# Patient Record
Sex: Female | Born: 1975 | Race: White | Marital: Married | Smoking: Never smoker
Health system: Southern US, Community
[De-identification: ages and names within clinical notes are randomized; demographics above are authoritative.]

## PROBLEM LIST (undated history)

## (undated) DIAGNOSIS — L709 Acne, unspecified: Secondary | ICD-10-CM

## (undated) DIAGNOSIS — F419 Anxiety disorder, unspecified: Secondary | ICD-10-CM

## (undated) HISTORY — PX: DILATION AND CURETTAGE OF UTERUS: SHX78

## (undated) HISTORY — PX: ELBOW SURGERY: SHX618

## (undated) HISTORY — PX: BREAST EXCISIONAL BIOPSY: SUR124

---

## 2011-04-15 ENCOUNTER — Other Ambulatory Visit: Payer: Self-pay | Admitting: Obstetrics and Gynecology

## 2011-04-15 DIAGNOSIS — Z1231 Encounter for screening mammogram for malignant neoplasm of breast: Secondary | ICD-10-CM

## 2011-05-05 ENCOUNTER — Ambulatory Visit: Payer: Self-pay

## 2013-11-23 ENCOUNTER — Other Ambulatory Visit: Payer: Self-pay | Admitting: Obstetrics and Gynecology

## 2013-11-23 DIAGNOSIS — R922 Inconclusive mammogram: Secondary | ICD-10-CM

## 2014-01-31 ENCOUNTER — Ambulatory Visit
Admission: RE | Admit: 2014-01-31 | Discharge: 2014-01-31 | Disposition: A | Discharge status: Home / Self Care | Payer: BC Managed Care – PPO | Source: Ambulatory Visit | Attending: Obstetrics and Gynecology | Admitting: Obstetrics and Gynecology

## 2014-01-31 DIAGNOSIS — R922 Inconclusive mammogram: Secondary | ICD-10-CM

## 2014-01-31 MED ORDER — GADOBENATE DIMEGLUMINE 529 MG/ML IV SOLN
11.0000 mL | Freq: Once | INTRAVENOUS | Status: AC | PRN
  Administered 2014-01-31: 11 mL via INTRAVENOUS

## 2014-01-31 MED ORDER — GADOBENATE DIMEGLUMINE 529 MG/ML IV SOLN: 11.0000 mL | Freq: Once | INTRAVENOUS | Status: AC | PRN

## 2015-01-22 ENCOUNTER — Other Ambulatory Visit: Payer: Self-pay | Admitting: Obstetrics and Gynecology

## 2015-01-22 DIAGNOSIS — Z803 Family history of malignant neoplasm of breast: Secondary | ICD-10-CM

## 2015-03-20 ENCOUNTER — Emergency Department (HOSPITAL_BASED_OUTPATIENT_CLINIC_OR_DEPARTMENT_OTHER)
Admission: EM | Admit: 2015-03-20 | Discharge: 2015-03-20 | Disposition: A | Discharge status: Home / Self Care | Payer: BC Managed Care – PPO | Attending: Emergency Medicine | Admitting: Emergency Medicine

## 2015-03-20 ENCOUNTER — Encounter (HOSPITAL_BASED_OUTPATIENT_CLINIC_OR_DEPARTMENT_OTHER): Payer: Self-pay

## 2015-03-20 DIAGNOSIS — Z79899 Other long term (current) drug therapy: Secondary | ICD-10-CM | POA: Diagnosis not present

## 2015-03-20 DIAGNOSIS — Z792 Long term (current) use of antibiotics: Secondary | ICD-10-CM | POA: Diagnosis not present

## 2015-03-20 DIAGNOSIS — T50905A Adverse effect of unspecified drugs, medicaments and biological substances, initial encounter: Secondary | ICD-10-CM

## 2015-03-20 DIAGNOSIS — T364X5A Adverse effect of tetracyclines, initial encounter: Secondary | ICD-10-CM | POA: Insufficient documentation

## 2015-03-20 DIAGNOSIS — R42 Dizziness and giddiness: Secondary | ICD-10-CM | POA: Diagnosis not present

## 2015-03-20 DIAGNOSIS — M546 Pain in thoracic spine: Secondary | ICD-10-CM | POA: Diagnosis present

## 2015-03-20 HISTORY — DX: Acne, unspecified: L70.9

## 2015-03-20 HISTORY — DX: Anxiety disorder, unspecified: F41.9

## 2015-03-20 MED ORDER — MECLIZINE HCL 25 MG PO TABS: 25.0000 mg | ORAL_TABLET | Freq: Four times a day (QID) | ORAL | Status: AC

## 2015-03-20 NOTE — ED Notes (Signed)
C/o neck pain, dizziness-started this am-states s/s feel same as with ? allergic reaction to bactrim approx 6 months ago-pt stated bactrim last night for "cystic acne"-2 doses-pt NAD with steady gait

## 2015-03-20 NOTE — ED Provider Notes (Signed)
CSN: [id number]     Arrival date & time 03/20/15  1229 History   First MD Initiated Contact with Patient 03/20/15 1321     Chief Complaint  Patient presents with  . Neck Pain     (Consider location/radiation/quality/duration/timing/severity/associated sxs/prior Treatment) Patient is a 39 y.o. female presenting with neck pain. The history is provided by the patient.  Neck Pain Associated symptoms: no chest pain, no fever, no headaches, no photophobia and no weakness    Patient with similar reaction when she took Septra in the past for her cystic acne. Patient is been taking doxycycline since that. Since it wasn't a classic allergic reaction her doctor decided to try her back on the Septra. She took the first dose last evening and second dose this morning at 8 and has had a recurrence of the exact same symptoms that occurred before with bilateral neck stiffness. And feeling weird. This time however there was vertigo associated with it. Patient does have some mild upper respiratory symptoms. No fevers no nausea no vomiting no lip swelling no tongue swelling no rash no hives. Check   Ease suggest pain Past Surgical History  Procedure Laterality Date  . Elbow surgery    . Dilation and curettage of uterus     No family history on file. History  Substance Use Topics  . Smoking status: Never Smoker   . Smokeless tobacco: Not on file  . Alcohol Use: Yes   OB History    No data available     Review of Systems  Constitutional: Negative for fever.  HENT: Negative for congestion.   Eyes: Negative for photophobia, redness and visual disturbance.  Respiratory: Negative for shortness of breath.   Cardiovascular: Negative for chest pain.  Gastrointestinal: Negative for nausea, vomiting and abdominal pain.  Genitourinary: Negative for dysuria.  Musculoskeletal: Positive for neck pain and neck stiffness.  Skin: Negative for rash.  Neurological: Positive for dizziness. Negative for speech  difficulty, weakness and headaches.  Hematological: Does not bruise/bleed easily.  Psychiatric/Behavioral: Negative for confusion.      Allergies  Sulfa antibiotics  Home Medications   Prior to Admission medications   Medication Sig Start Date End Date Taking? Authorizing Provider  doxycycline (ORACEA) 40 MG capsule Take 40 mg by mouth every morning.   Yes Historical Provider, MD  PARoxetine HCl (PAXIL PO) Take by mouth.   Yes Historical Provider, MD  meclizine (ANTIVERT) 25 MG tablet Take 1 tablet (25 mg total) by mouth 4 (four) times daily. 03/20/15   Vanetta Mulders, MD   BP 140/92 mmHg  Pulse 82  Temp(Src) 97.6 F (36.4 C) (Oral)  Resp 18  Ht 5\' 6"  (1.676 m)  Wt 125 lb (56.7 kg)  BMI 20.19 kg/m2  SpO2 98% Physical Exam  Constitutional: She is oriented to person, place, and time. She appears well-developed and well-nourished. No distress.  HENT:  Head: Normocephalic and atraumatic.  Mouth/Throat: Oropharynx is clear and moist.  Eyes: Conjunctivae and EOM are normal. Pupils are equal, round, and reactive to light.  Neck: Normal range of motion. No tracheal deviation present.  Neck with the stiffness not so much posteriorly but with left to right movement.   Cardiovascular: Normal rate and regular rhythm.   Pulmonary/Chest: Effort normal and breath sounds normal. No stridor. No respiratory distress.  Abdominal: Soft. Bowel sounds are normal. There is no tenderness.  Musculoskeletal: Normal range of motion.  Lymphadenopathy:    She has no cervical adenopathy.  Neurological: She is  alert and oriented to person, place, and time. No cranial nerve deficit. She exhibits normal muscle tone. Coordination normal.  Skin: Skin is warm. No rash noted. No erythema.  Nursing note and vitals reviewed.   ED Course  Procedures (including critical care time) Labs Review Labs Reviewed - No data to display  Imaging Review No results found.   EKG Interpretation None      MDM    Final diagnoses:  Medication side effect, initial encounter  Vertigo    Patient with bilateral neck pain and vertigo. And some dizziness. Very similar symptoms that happened before when she was placed on Septra for her acne. Patient was switched from doxycycline by her doctor and started Septra last night had one dose and had another dose this morning at about 8 in the morning. Same symptoms of occurred with the neck stiffness. Patient has no lip swelling no tongue swelling no rash no hives no symptoms classic for an allergic reaction. It is possible this could be a side effect to Septra that is unique to her. Probably not just a coincidence as this has occurred again when she's taken the Septra. In addition she says symptoms are starting to resolve which occurred as the Septra were off before.  The dizziness and vertigo are somewhat new. Patient does have a mild upper respiratory infection. This possible that that could be viral based. Or could be related to the Septra. Will treat the patient with anti-for if that persists if it does not resolve over 70 days will require additional workup. Patient feels strongly that all symptoms are related to the Septra.  Patient did have a tick bite recently but going back on the doxycycline should resolve that as a possible cause of infection.   Patient is nontoxic no acute distress.    Vanetta Mulders, MD 03/20/15 1349

## 2015-03-20 NOTE — Discharge Instructions (Signed)
Stop the Septra Bactrim medication. Restart doxycycline. Take Benadryl 25 mg every 6 hours at least for 24 hours. Take Motrin or Naprosyn as needed. If the vertigo symptoms do not improve by tomorrow would recommend taking the Antivert. If the vertigo symptoms persist for a week would recommend additional workup. Return for any new or worse symptoms to include fever.

## 2016-04-12 ENCOUNTER — Other Ambulatory Visit: Payer: Self-pay | Admitting: Obstetrics and Gynecology

## 2016-04-12 DIAGNOSIS — Z803 Family history of malignant neoplasm of breast: Secondary | ICD-10-CM

## 2016-05-05 ENCOUNTER — Ambulatory Visit
Admission: RE | Admit: 2016-05-05 | Discharge: 2016-05-05 | Disposition: A | Discharge status: Home / Self Care | Payer: BC Managed Care – PPO | Source: Ambulatory Visit | Attending: Obstetrics and Gynecology | Admitting: Obstetrics and Gynecology

## 2016-05-05 DIAGNOSIS — Z803 Family history of malignant neoplasm of breast: Secondary | ICD-10-CM

## 2017-04-08 ENCOUNTER — Other Ambulatory Visit: Payer: Self-pay | Admitting: Nurse Practitioner

## 2017-04-08 DIAGNOSIS — N63 Unspecified lump in unspecified breast: Secondary | ICD-10-CM

## 2017-04-11 ENCOUNTER — Ambulatory Visit
Admission: RE | Admit: 2017-04-11 | Discharge: 2017-04-11 | Disposition: A | Discharge status: Home / Self Care | Payer: Self-pay | Source: Ambulatory Visit | Attending: Nurse Practitioner | Admitting: Nurse Practitioner

## 2017-04-11 ENCOUNTER — Ambulatory Visit
Admission: RE | Admit: 2017-04-11 | Discharge: 2017-04-11 | Disposition: A | Discharge status: Home / Self Care | Payer: BC Managed Care – PPO | Source: Ambulatory Visit | Attending: Nurse Practitioner | Admitting: Nurse Practitioner

## 2017-04-11 DIAGNOSIS — N63 Unspecified lump in unspecified breast: Secondary | ICD-10-CM

## 2017-04-14 ENCOUNTER — Other Ambulatory Visit: Payer: Self-pay

## 2018-02-13 ENCOUNTER — Ambulatory Visit: Payer: Self-pay | Admitting: Pediatrics

## 2018-03-29 ENCOUNTER — Other Ambulatory Visit: Payer: Self-pay | Admitting: Obstetrics and Gynecology

## 2018-03-29 DIAGNOSIS — Z803 Family history of malignant neoplasm of breast: Secondary | ICD-10-CM

## 2018-04-11 ENCOUNTER — Ambulatory Visit
Admission: RE | Admit: 2018-04-11 | Discharge: 2018-04-11 | Disposition: A | Discharge status: Home / Self Care | Payer: BC Managed Care – PPO | Source: Ambulatory Visit | Attending: Obstetrics and Gynecology | Admitting: Obstetrics and Gynecology

## 2018-04-11 DIAGNOSIS — Z803 Family history of malignant neoplasm of breast: Secondary | ICD-10-CM

## 2018-04-11 MED ORDER — GADOBENATE DIMEGLUMINE 529 MG/ML IV SOLN
10.0000 mL | Freq: Once | INTRAVENOUS | Status: AC | PRN
  Administered 2018-04-11: 10 mL via INTRAVENOUS

## 2018-08-28 ENCOUNTER — Other Ambulatory Visit: Payer: Self-pay | Admitting: Obstetrics and Gynecology

## 2018-08-28 DIAGNOSIS — N6489 Other specified disorders of breast: Secondary | ICD-10-CM

## 2018-10-18 ENCOUNTER — Ambulatory Visit
Admission: RE | Admit: 2018-10-18 | Discharge: 2018-10-18 | Disposition: A | Discharge status: Home / Self Care | Payer: BC Managed Care – PPO | Source: Ambulatory Visit | Attending: Obstetrics and Gynecology | Admitting: Obstetrics and Gynecology

## 2018-10-18 DIAGNOSIS — N6489 Other specified disorders of breast: Secondary | ICD-10-CM

## 2018-10-18 MED ORDER — GADOBUTROL 1 MMOL/ML IV SOLN
5.0000 mL | Freq: Once | INTRAVENOUS | Status: AC | PRN
  Administered 2018-10-18: 5 mL via INTRAVENOUS

## 2018-10-23 ENCOUNTER — Other Ambulatory Visit: Payer: Self-pay | Admitting: Obstetrics and Gynecology

## 2018-10-23 DIAGNOSIS — N6310 Unspecified lump in the right breast, unspecified quadrant: Secondary | ICD-10-CM

## 2018-10-31 ENCOUNTER — Other Ambulatory Visit: Payer: Self-pay | Admitting: Obstetrics and Gynecology

## 2018-10-31 ENCOUNTER — Ambulatory Visit
Admission: RE | Admit: 2018-10-31 | Discharge: 2018-10-31 | Disposition: A | Discharge status: Home / Self Care | Payer: BC Managed Care – PPO | Source: Ambulatory Visit | Attending: Obstetrics and Gynecology | Admitting: Obstetrics and Gynecology

## 2018-10-31 DIAGNOSIS — N6310 Unspecified lump in the right breast, unspecified quadrant: Secondary | ICD-10-CM

## 2018-11-01 ENCOUNTER — Other Ambulatory Visit: Payer: Self-pay | Admitting: Obstetrics and Gynecology

## 2018-11-01 DIAGNOSIS — N6310 Unspecified lump in the right breast, unspecified quadrant: Secondary | ICD-10-CM

## 2019-05-03 ENCOUNTER — Other Ambulatory Visit: Payer: BC Managed Care – PPO

## 2019-08-02 ENCOUNTER — Other Ambulatory Visit: Payer: Self-pay | Admitting: Obstetrics and Gynecology

## 2019-08-02 DIAGNOSIS — N6310 Unspecified lump in the right breast, unspecified quadrant: Secondary | ICD-10-CM

## 2019-08-10 ENCOUNTER — Other Ambulatory Visit: Payer: BC Managed Care – PPO

## 2019-08-17 ENCOUNTER — Other Ambulatory Visit: Payer: BC Managed Care – PPO

## 2019-09-04 ENCOUNTER — Ambulatory Visit
Admission: RE | Admit: 2019-09-04 | Discharge: 2019-09-04 | Disposition: A | Discharge status: Home / Self Care | Payer: BC Managed Care – PPO | Source: Ambulatory Visit | Attending: Obstetrics and Gynecology | Admitting: Obstetrics and Gynecology

## 2019-09-04 ENCOUNTER — Other Ambulatory Visit: Payer: Self-pay

## 2019-09-04 DIAGNOSIS — N6310 Unspecified lump in the right breast, unspecified quadrant: Secondary | ICD-10-CM

## 2020-03-09 IMAGING — MR MR BILATERAL BREAST WITHOUT AND WITH CONTRAST
8 of 12 series · 30 of 48 positions shown · IV contrast (5 ml gadavist)
Comparison: Prior breast MRIs dated 04/11/2018, 05/05/2016 and
06/02/2014.

CLINICAL DATA: Patient reports a new lump in the right breast.
and a reported lumpectomy in 5552. This was presumably a benign
lumpectomy since there is no reported history breast carcinoma in
this patient. Her mother was diagnosed with breast carcinoma at age
42. Most recent prior breast MRI showed multiple enhancing
foci/masses greater in the right breast than the left.

LABS:  No labs drawn at time of imaging
EXAM:
BILATERAL BREAST MRI WITH AND WITHOUT CONTRAST
TECHNIQUE: Multiplanar, multisequence MR images of both breasts were obtained
prior to and following the intravenous administration of 5 ml of
Gadavist

[Series 2: t2_tirm_tra ipat (a-p) · axial · 3.0mm · 0.66mm/px · 1 of 55 slices shown]
[im 1/55]
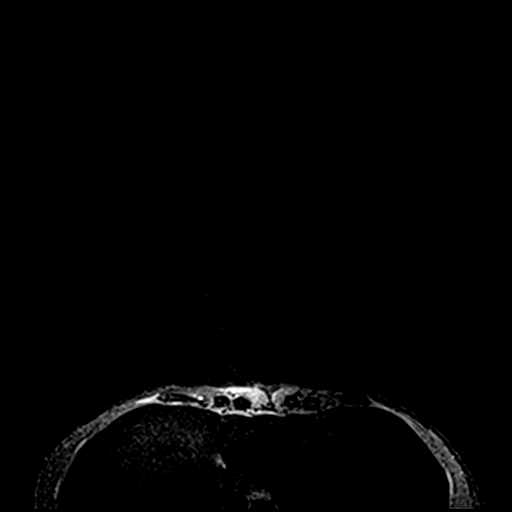

[Series 3: fl3d pre-cm no · axial · non-contrast · 1.2mm · 0.89mm/px · z∈[-79,+93]mm · 5 of 144 slices shown]
[im 1/144]
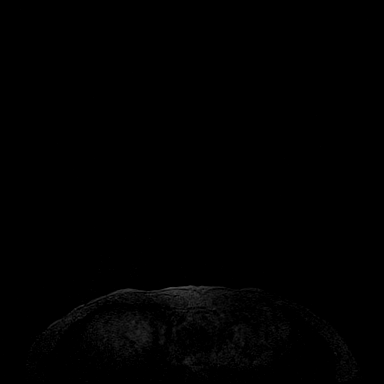
[im 36/144]
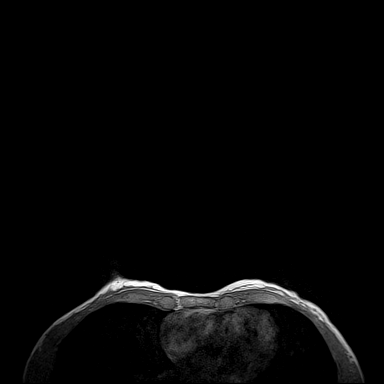
[im 72/144]
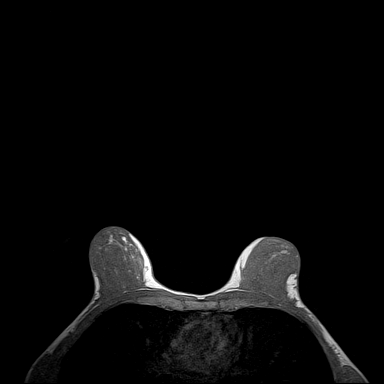
[im 108/144]
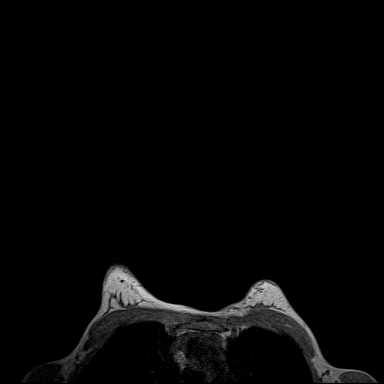
[im 144/144]
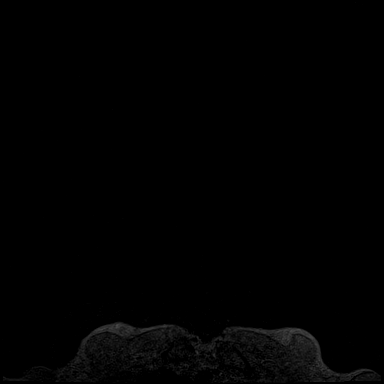

[Series 4: fl3d pre-cm · axial · non-contrast · 1.2mm · 0.89mm/px · z∈[-79,+93]mm · 5 of 144 slices shown]
[im 1/144]
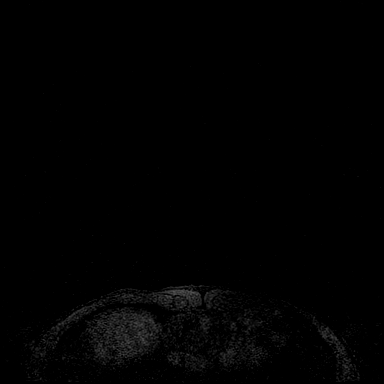
[im 36/144]
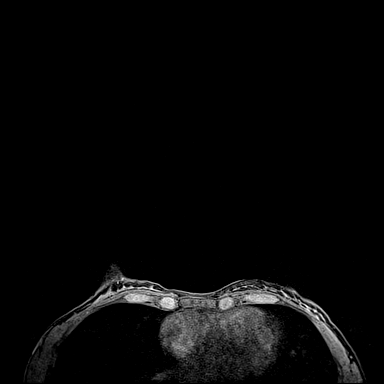
[im 72/144]
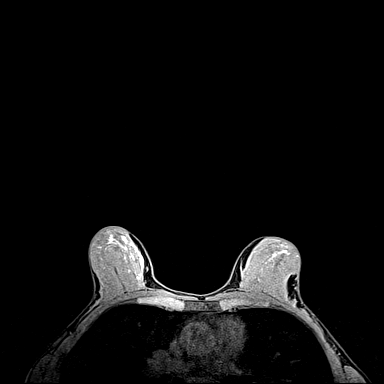
[im 108/144]
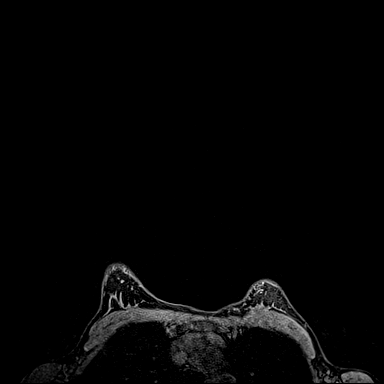
[im 144/144]
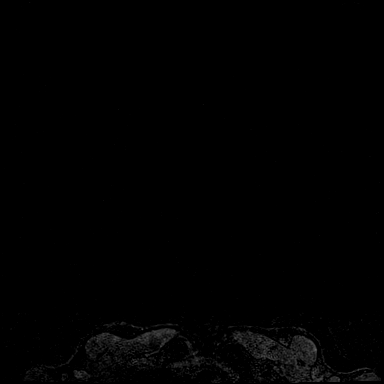

[Series 5: fl3d post immediate · axial · 1.2mm · 0.89mm/px · z∈[-79,+93]mm · 5 of 144 slices shown (1 of 3)]
[im 1/144]
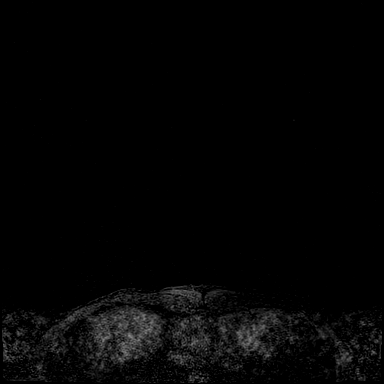
[im 36/144]
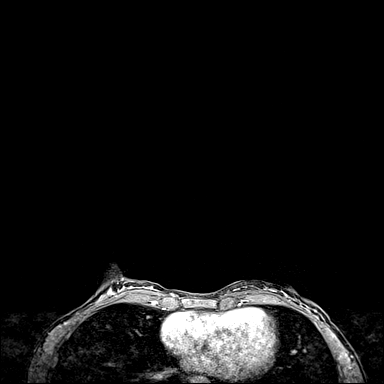
[im 72/144]
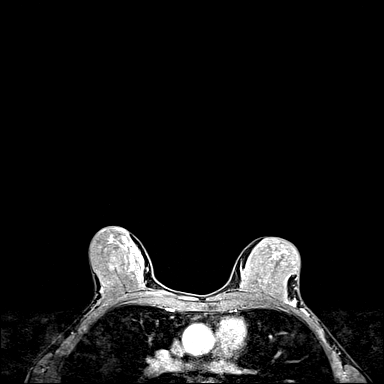
[im 108/144]
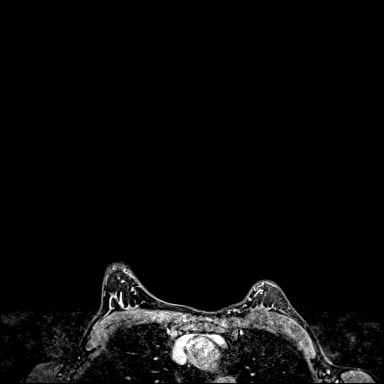
[im 144/144]
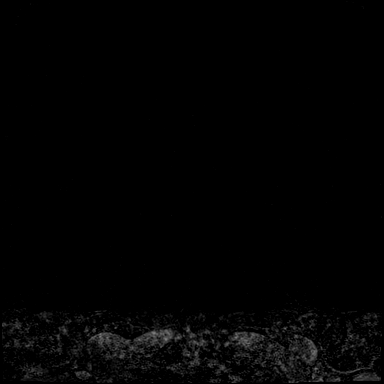

[Series 6: fl3d post immediate · axial · 1.2mm · 0.89mm/px · z∈[-79,+93]mm · 5 of 144 slices shown (2 of 3)]
[im 1/144]
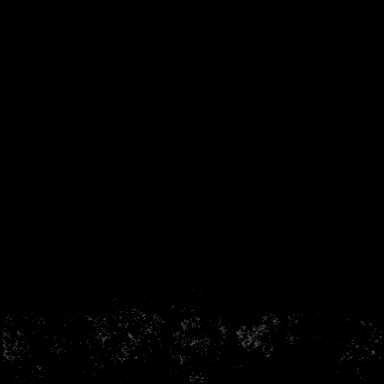
[im 36/144]
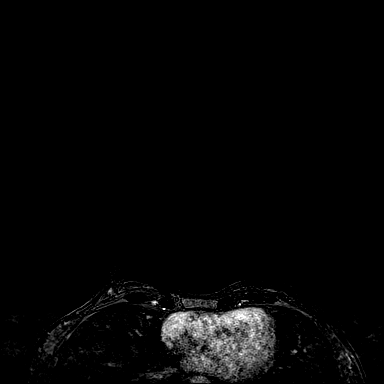
[im 72/144]
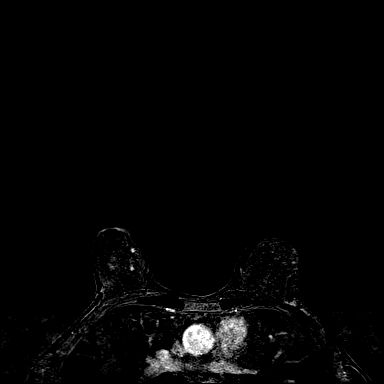
[im 108/144]
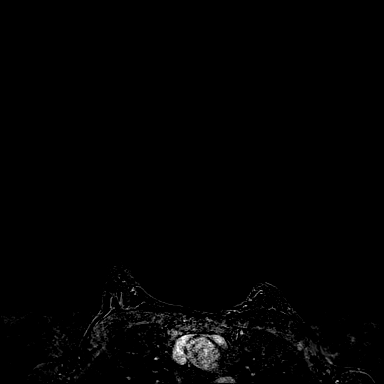
[im 144/144]
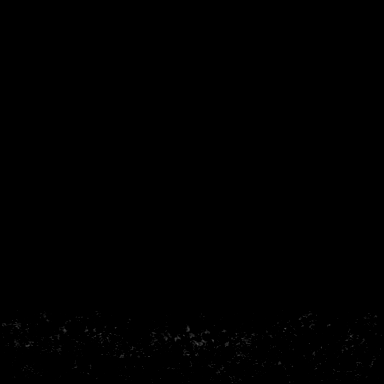

[Series 7: fl3d post immediate · axial · 172.8mm · 0.89mm/px · 1 of 1 slices shown (3 of 3)]
[im 1/1]
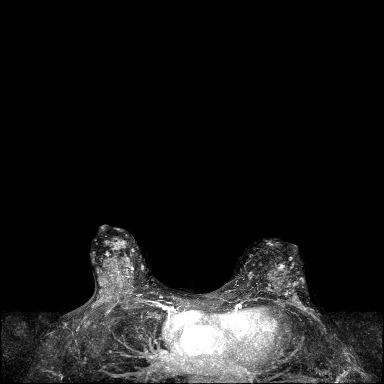

[Series 8: fl3d post 3min · axial · 1.2mm · 0.89mm/px · z∈[-79,+93]mm · 6 of 144 slices shown]
[im 1/144]
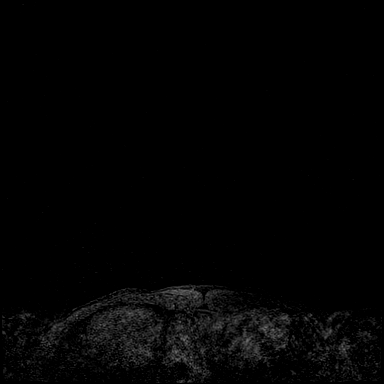
[im 29/144]
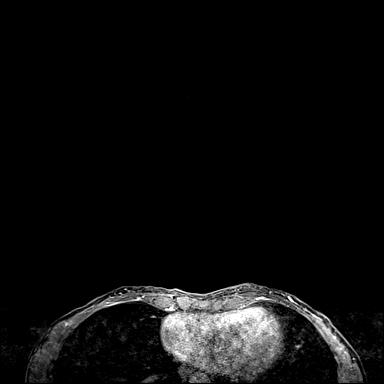
[im 58/144]
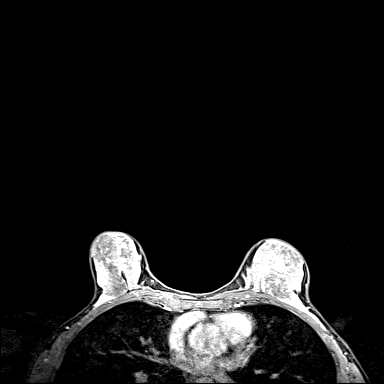
[im 86/144]
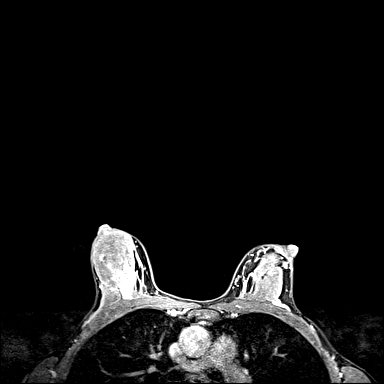
[im 115/144]
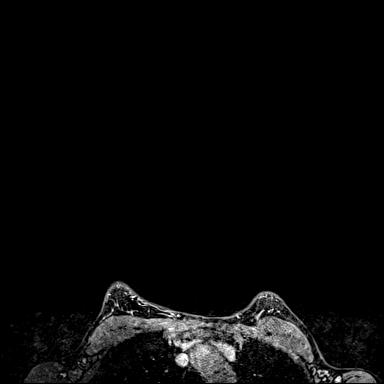
[im 144/144]
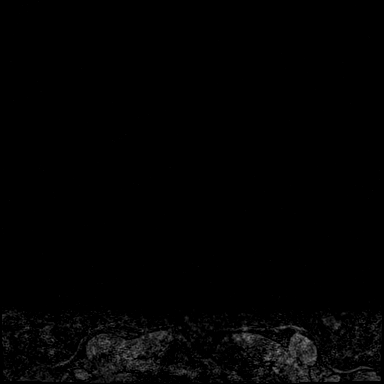

[Series 9: fl3d post 3min_sub · axial · 1.2mm · 0.89mm/px · z∈[-79,-45]mm · 2 of 144 slices shown]
[im 1/144]
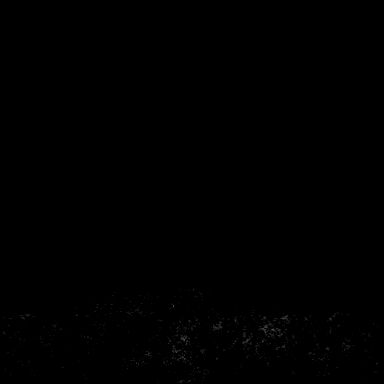
[im 29/144]
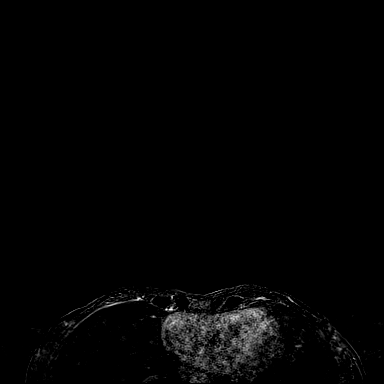

[30 of 48 positions shown; findings below may reference images not displayed]

Three-dimensional MR images were rendered by post-processing of the
original MR data on an independent workstation. The
three-dimensional MR images were interpreted, and findings are
reported in the following complete MRI report for this study. Three
dimensional images were evaluated at the independent [employer]
workstation
Prior mammography and breast ultrasound, most recent
right breast diagnostic mammography and ultrasound dated 10/31/2018.
FINDINGS: Breast composition: d. Extreme fibroglandular tissue.

Background parenchymal enhancement: Extensive

Right breast: In addition to extensive background enhancement, which
is asymmetrically greater on the right than on the left, there are
multiple focal areas enhancement, greater in number than noted on
the most recent prior breast MRI, but none of which are dominant or
show more aggressive enhancement characteristics. There are no
discrete masses or areas of abnormal enhancement to indicate breast
malignancy.

Left breast: There are multiple small foci/focal areas of
enhancement, in addition to more diffuse background enhancement,
that are similar to those seen on the right, but less in number.
Overall background enhancement is less on the left than on the
right. There are no dominant enhancing masses, however, to suggest
malignancy.

Lymph nodes: No abnormal appearing lymph nodes.

Ancillary findings:  None.
IMPRESSION: 1. There is extensive bilateral background enhancement, right
greater than left, associated with multiple small enhancing foci and
larger focal areas of enhancement, also greater the right. However,
there are no dominant enhancing lesion on either side to suggest
malignancy. All of this abnormal enhancement is felt to be extensive
background enhancement. The enhancing foci/masses noted on the most
recent prior MRI, again more on the right than the left, have
increased in number from that exam. Several, however, are similar to
older exams, and the extensive background enhancement was seen on
older prior breast MRIs. These findings of extensive background
enhancement and multiple foci/focal areas similar appearing
enhancement in the breasts show a waxing and waning appearance over
the course the prior MRIs dating back to 9651. All of this supports
these areas being background enhancement only.
2. Diagnostic right breast mammography and ultrasound performed
yesterday, for a palpable right breast abnormality, showed a
probably benign mass on ultrasound with no mammographic abnormality.
The ultrasound mass was felt most likely to be a lymph node. She was
scheduled for six-month diagnostic right breast mammography and
ultrasound.

RECOMMENDATION:
1. Six-month diagnostic right breast mammography and ultrasound as
recommended on the diagnostic mammography and ultrasound performed
on 10/31/2018.
[DATE]. Recommend annual breast MRI based on the [employer]
recommendations given the patient's greater than 20% lifetime risk
for developing breast carcinoma and her extremely dense breasts
architecture mammographically.

BI-RADS CATEGORY  2: Benign.

## 2020-03-18 ENCOUNTER — Ambulatory Visit: Payer: Self-pay | Admitting: Allergy and Immunology

## 2021-01-01 ENCOUNTER — Other Ambulatory Visit: Payer: Self-pay | Admitting: Obstetrics and Gynecology

## 2021-01-01 DIAGNOSIS — Z9189 Other specified personal risk factors, not elsewhere classified: Secondary | ICD-10-CM

## 2021-01-17 ENCOUNTER — Ambulatory Visit
Admission: RE | Admit: 2021-01-17 | Discharge: 2021-01-17 | Disposition: A | Discharge status: Home / Self Care | Payer: BC Managed Care – PPO | Source: Ambulatory Visit | Attending: Obstetrics and Gynecology | Admitting: Obstetrics and Gynecology

## 2021-01-17 ENCOUNTER — Other Ambulatory Visit: Payer: Self-pay

## 2021-01-17 DIAGNOSIS — Z9189 Other specified personal risk factors, not elsewhere classified: Secondary | ICD-10-CM

## 2021-01-17 MED ORDER — GADOBUTROL 1 MMOL/ML IV SOLN
6.0000 mL | Freq: Once | INTRAVENOUS | Status: AC | PRN
  Administered 2021-01-17: 6 mL via INTRAVENOUS

## 2021-09-15 ENCOUNTER — Ambulatory Visit (HOSPITAL_BASED_OUTPATIENT_CLINIC_OR_DEPARTMENT_OTHER): Payer: BC Managed Care – PPO | Admitting: Orthopaedic Surgery

## 2021-10-06 ENCOUNTER — Other Ambulatory Visit: Payer: Self-pay

## 2021-10-06 ENCOUNTER — Other Ambulatory Visit (HOSPITAL_BASED_OUTPATIENT_CLINIC_OR_DEPARTMENT_OTHER): Payer: Self-pay | Admitting: Orthopaedic Surgery

## 2021-10-06 ENCOUNTER — Ambulatory Visit (HOSPITAL_BASED_OUTPATIENT_CLINIC_OR_DEPARTMENT_OTHER)
Admission: RE | Admit: 2021-10-06 | Discharge: 2021-10-06 | Disposition: A | Discharge status: Home / Self Care | Payer: BC Managed Care – PPO | Source: Ambulatory Visit | Attending: Orthopaedic Surgery | Admitting: Orthopaedic Surgery

## 2021-10-06 ENCOUNTER — Ambulatory Visit (HOSPITAL_BASED_OUTPATIENT_CLINIC_OR_DEPARTMENT_OTHER): Payer: BC Managed Care – PPO | Admitting: Orthopaedic Surgery

## 2021-10-06 DIAGNOSIS — M25559 Pain in unspecified hip: Secondary | ICD-10-CM | POA: Insufficient documentation

## 2021-10-06 DIAGNOSIS — M25551 Pain in right hip: Secondary | ICD-10-CM | POA: Diagnosis not present

## 2021-10-06 DIAGNOSIS — S76011A Strain of muscle, fascia and tendon of right hip, initial encounter: Secondary | ICD-10-CM

## 2021-10-06 MED ORDER — TRIAMCINOLONE ACETONIDE 40 MG/ML IJ SUSP
80.0000 mg | INTRAMUSCULAR | Status: AC | PRN
  Administered 2021-10-06: 15:00:00 80 mg via INTRA_ARTICULAR

## 2021-10-06 MED ORDER — LIDOCAINE HCL 1 % IJ SOLN
4.0000 mL | INTRAMUSCULAR | Status: AC | PRN
  Administered 2021-10-06: 15:00:00 4 mL

## 2021-10-06 NOTE — Progress Notes (Signed)
Chief Complaint: right hip pain     History of Present Illness:    Joanna Mann is a 45 y.o. female presents with right hip pain is been going on and off for approximately 5 years.  She states that she is an avid runner and running water really flared this up.  This is to the point where she is been only able to run approximately 1-1/2 miles.  Her goal is to ultimately run a marathon.  States that the hip pain is limiting her ability to do this.  She is having difficulty laying directly on the side.  She takes Aleve which does not relieve the pain.  She has not had injections    Surgical History:   None  PMH/PSH/Family History/Social History/Meds/Allergies:    Past Medical History:  Diagnosis Date   Acne    Anxiety    Past Surgical History:  Procedure Laterality Date   BREAST EXCISIONAL BIOPSY Left 2004, 2006   x 2, both benign   DILATION AND CURETTAGE OF UTERUS     ELBOW SURGERY     Social History   Socioeconomic History   Marital status: Married    Spouse name: Not on file   Number of children: Not on file   Years of education: Not on file   Highest education level: Not on file  Occupational History   Not on file  Tobacco Use   Smoking status: Never   Smokeless tobacco: Not on file  Substance and Sexual Activity   Alcohol use: Yes   Drug use: No   Sexual activity: Yes    Birth control/protection: Pill  Other Topics Concern   Not on file  Social History Narrative   Not on file   Social Determinants of Health   Financial Resource Strain: Not on file  Food Insecurity: Not on file  Transportation Needs: Not on file  Physical Activity: Not on file  Stress: Not on file  Social Connections: Not on file   Family History  Problem Relation Age of Onset   Breast cancer Mother    Breast cancer Paternal Grandmother    Allergies  Allergen Reactions   Sulfa Antibiotics Other (See Comments)    Neck  pain and dizziness   Current Outpatient Medications  Medication Sig Dispense Refill   doxycycline (ORACEA) 40 MG capsule Take 40 mg by mouth every morning.     meclizine (ANTIVERT) 25 MG tablet Take 1 tablet (25 mg total) by mouth 4 (four) times daily. 28 tablet 0   PARoxetine HCl (PAXIL PO) Take by mouth.     No current facility-administered medications for this visit.   No results found.  Review of Systems:   A ROS was performed including pertinent positives and negatives as documented in the HPI.  Physical Exam :   Constitutional: NAD and appears stated age Neurological: Alert and oriented Psych: Appropriate affect and cooperative There were no vitals taken for this visit.   Comprehensive Musculoskeletal Exam:    Inspection Right Left  Skin No atrophy or gross abnormalities appreciated No atrophy or gross abnormalities appreciated  Palpation    Tenderness None None  Crepitus None None  Range of Motion    Flexion (passive) 120 120  Extension 30 30  IR 30 30  ER 40 40  Strength    Flexion  5/5 5/5  Extension 5/5 5/5  Special Tests    FABIR Negative Negative  FADER Negative Negative  ER Lag/Capsular Insufficiency Negative Negative  Instability Negative Negative  Sacroiliac pain Negative  Negative   Instability    Generalized Laxity No No  Neurologic    sciatic, femoral, obturator nerves intact to light sensation  Vascular/Lymphatic    DP pulse 2+ 2+  Lumbar Exam    Patient has symmetric lumbar range of motion with negative pain referral to hip     Imaging:   Xray (3 views right hip and AP pelvis): Normal  I personally reviewed and interpreted the radiographs.   Assessment:   45 year old female with right hip pain now going on for 5 years.  Specifically I do believe that she is got gluteus medius tendinitis versus tearing.  To that effect I have offered her a ultrasound-guided injection into the gluteus medius as well as a physical therapy program I  would like her to go to physical therapy in order to obtain a home exercise program which she can work on multiple times a week.  I will see her back in 4 to 6 weeks to reassess  Plan :    -Return to clinic 4 to 6 weeks -PT ordered in order to give her a gluteus medius home exercise program     Procedure Note  Patient: Joanna Mann             Date of Birth: [birth year]           MRN: [id number]             Visit Date: 10/06/2021  Procedures: Visit Diagnoses: No diagnosis found.  Large Joint Inj: R greater trochanter on 10/06/2021 3:13 PM Indications: pain Details: 22 G 3.5 in needle, ultrasound-guided anterolateral approach  Arthrogram: No  Medications: 4 mL lidocaine 1 %; 80 mg triamcinolone acetonide 40 MG/ML Outcome: tolerated well, no immediate complications Procedure, treatment alternatives, risks and benefits explained, specific risks discussed. Consent was given by the patient. Immediately prior to procedure a time out was called to verify the correct patient, procedure, equipment, support staff and site/side marked as required. Patient was prepped and draped in the usual sterile fashion.       I personally saw and evaluated the patient, and participated in the management and treatment plan.  Huel Cote, MD Attending Physician, Orthopedic Surgery  This document was dictated using Dragon voice recognition software. A reasonable attempt at proof reading has been made to minimize errors.

## 2021-10-08 NOTE — Therapy (Signed)
OUTPATIENT PHYSICAL THERAPY LOWER EXTREMITY EVALUATION   Patient Name: Joanna Mann MRN: [id number] DOB:Nov 08, 1975, 45 y.o., female Today's Date: 10/09/2021   PT End of Session - 10/09/21 1556     Visit Number 1    Number of Visits 9    Date for PT Re-Evaluation 11/20/21    Authorization Type BCBS    Progress Note Due on Visit --   11/09/2021   PT Start Time 1109    PT Stop Time 1154    PT Time Calculation (min) 45 min    Activity Tolerance Patient tolerated treatment well    Behavior During Therapy [facility] for tasks assessed/performed             Past Medical History:  Diagnosis Date   Acne    Anxiety    Past Surgical History:  Procedure Laterality Date   BREAST EXCISIONAL BIOPSY Left 2004, 2006   x 2, both benign   DILATION AND CURETTAGE OF UTERUS     ELBOW SURGERY     There are no problems to display for this patient.   PCP: Reynold Bowen., MD  REFERRING PROVIDER: Huel Cote, MD  REFERRING DIAG: [phone] (ICD-10-CM) - Strain of gluteus medius of right lower extremity, initial encounter   THERAPY DIAG:  Pain in right hip  Muscle weakness (generalized)  ONSET DATE: October 2022  SUBJECTIVE:   SUBJECTIVE STATEMENT: Pt has been having right hip pain on and off for approximately 5 years.  Pt signed up for an Iron[employer] though had bursitis in bilat hips when training.  Pt increased her frequency of running beginning 2 months ago and began having increased R hip pain.  Pt was running 3 miles per day 3 x/wk.  Pt had to stop running due to pain.  Pt had increased pain trying to take shoe off in a standing position.  Pt states she could feel her hip with most activities.  She enjoys backpacking and had occasional pain with backpacking very long distance.  She had to be careful with long distance. Pt states walking does not bother her.  She does have increased pain and limp with walking after she ran. Pt ran 1.5 miles on Tuesday prior to seeing MD and had  increased pain.  Pt saw MD on 10/06/2021 and had x rays which were negative.  MD note indicated he believed glute medius tendinitis vs tearing.  Pt received an ultrasound-guided injection into the gluteus medius.  MD ordered PT and MD script indicated R gluteus medius strengthening program.  Pt states she feels better after receiving the injection including being able to take shoe off in a standing position with much less pain.  Pt reports she was instructed to run by MD after injection to see if she had pain and she was unable to run due to pain.    PERTINENT HISTORY: none  PAIN:  Are you having pain? Yes VAS scale: 1/10 current pain,6 /10 worst, 0/10 best Pain location: lateral and posterior R hip/glute  PAIN TYPE: b/w an aching and sharp pain Aggravating factors: running Relieving factors: rest, aleve, injection  PRECAUTIONS: None  WEIGHT BEARING RESTRICTIONS No  FALLS:  Has patient fallen in last 6 months? 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  PLOF: Independent.  Pt able to backpack with less pain.   PATIENT GOALS Pt wants to run a couple of miles per day 3-4 times per week without pain.     OBJECTIVE:   DIAGNOSTIC  FINDINGS: X ray of R hip: negative.   PATIENT SURVEYS:  FOTO 61 with a goal of 53  COGNITION:  Overall cognitive status: Within functional limits for tasks assessed      PALPATION: No tenderness noted t/o bilat hips and glutes  LE AROM/PROM:  A/PROM Right 10/09/2021 Left 10/09/2021  Hip flexion    Hip extension WNL WNL  Hip abduction WNL WNL  Hip adduction WNL WNL  Hip internal rotation 27 felt tightness 20  Hip external rotation 33 felt tightness 32  Knee flexion    Knee extension    Ankle dorsiflexion    Ankle plantarflexion    Ankle inversion    Ankle eversion     (Blank rows = not tested)  LE MMT:  MMT Right 10/09/2021 Left 10/09/2021  Hip flexion 5/5 5/5  Hip extension 4/5 4+/5  Hip abduction 4+/5 5/5  Hip  adduction 5/5 5/5  Hip internal rotation 4/5 5/5  Hip external rotation 4/5 5/5  Knee flexion 5/5 5/5  Knee extension 5/5 5/5  Ankle dorsiflexion    Ankle plantarflexion    Ankle inversion    Ankle eversion     (Blank rows = not tested)  LOWER EXTREMITY SPECIAL TESTS:  Ober's Test: negative bilat Thomas Test:  negative bilat   GAIT: Assistive device utilized: None Level of assistance: Complete Independence Comments: Pt ambulates with a normalized heel to toe gait without limping.     TODAY'S TREATMENT: Pt performed S/L Hip abduction 2x10 reps, S/L clamshells AROM x 10 reps and  with RTB x 10 reps, and supine bridge SL 2x10 reps.  Pt received a HEP handout and was educated in correct form and appropriate frequency.     PATIENT EDUCATION:  Education details: dx, relevant anatomy, POC, and objective findings.   Pt received a HEP handout and was educated in correct form and appropriate frequency.  Pt instructed she should not have pain with HEP. Person educated: Patient Education method: Explanation, Demonstration, Tactile cues, Verbal cues, and Handouts Education comprehension: verbalized understanding, returned demonstration, verbal cues required, tactile cues required, and needs further education   HOME EXERCISE PROGRAM: Access Code: [email or web address] URL: [email or web address] Date: 10/09/2021 Prepared by: Aaron Edelman  Exercises Sidelying Hip Abduction - 1 x daily - 5-7 x weekly - 2 sets - 10 reps Clamshell with Resistance - 1 x daily - 4-5 x weekly - 2 sets - 10 reps Supine Bridge - 1 x daily - 5-6 x weekly - 2 sets - 10 reps   ASSESSMENT:  CLINICAL IMPRESSION: Patient is a 45 y.o. female with a dx of gluteus medius tendinitis presenting to the clinic with R hip pain and R glute weakness.  Pt states she could feel her hip with most activities, but is feeling better since receiving the US guided injection from MD.  Pt is unable to perform her normal running  program due to hip pain.  She enjoys backpacking and had occasional pain with backpacking very long distance.  Pt states walking does not bother her though she does have pain and limps with walking after she ran.  Pt should benefit from skilled PT services to address above impairments and improve overall function.    Objective impairments include decreased activity tolerance, decreased mobility, decreased strength, and pain. These impairments are limiting patient from  running .   REHAB POTENTIAL: Good  CLINICAL DECISION MAKING: Stable/uncomplicated  EVALUATION COMPLEXITY: Low   GOALS:   SHORT TERM GOALS:  STG Name Target Date  Goal status  1 Pt will be independent and compliant with HEP for improved pain, strength, and function.   Baseline:  10/23/2021 INITIAL  2 Pt's worst pain will be less than 3/10 for improved performance of running Baseline:  11/06/2021 INITIAL  3 Pt will report at least a 30% improvement in pain and sx's overall Baseline: 10/30/2021 INITIAL  4 Pt will be able to run short distance with no > 2/10 pain  Baseline: 11/06/2021 INITIAL                  LONG TERM GOALS:   LTG Name Target Date Goal status  1 Pt will demo 5/5 R hip strength t/o for improved tolerance with running and daily activities.  Baseline: 11/20/2021 INITIAL  2 Pt will demo good form with running with symmetrical Wb'ing without limping. Baseline: 11/20/2021 INITIAL  3 Pt will be able to progressively increase her running without significant pain Baseline: 11/20/2021 INITIAL  4 Pt will be able to perform her ADLs/IADLs and normal functional mobility skills without increased hip pain.   Baseline: 11/20/2021 INITIAL   PLAN: PT FREQUENCY: 1-2x/week  PT DURATION: 6 weeks  PLANNED INTERVENTIONS: Therapeutic exercises, Therapeutic activity, Neuro Muscular re-education, Gait training, Patient/Family education, Joint mobilization, Stair training, Aquatic Therapy, Dry Needling, Electrical stimulation,  Spinal mobilization, Cryotherapy, Moist heat, Taping, Ultrasound, and Manual therapy  PLAN FOR NEXT SESSION: Review and perform HEP.  If pt is doing well with S/L hip abd add in eccentric lowering.  Add lateral band walks next visit.  Cont with glute/hip strengthening and proprio.  STW to glute and hip.  Add running drills when pt is able to tolerate.    Audie Clear III PT, DPT 10/09/21 4:21 PM

## 2021-10-09 ENCOUNTER — Ambulatory Visit (HOSPITAL_BASED_OUTPATIENT_CLINIC_OR_DEPARTMENT_OTHER): Payer: BC Managed Care – PPO | Attending: Orthopaedic Surgery | Admitting: Physical Therapy

## 2021-10-09 ENCOUNTER — Encounter (HOSPITAL_BASED_OUTPATIENT_CLINIC_OR_DEPARTMENT_OTHER): Payer: Self-pay | Admitting: Physical Therapy

## 2021-10-09 ENCOUNTER — Other Ambulatory Visit: Payer: Self-pay

## 2021-10-09 DIAGNOSIS — M25551 Pain in right hip: Secondary | ICD-10-CM | POA: Diagnosis not present

## 2021-10-09 DIAGNOSIS — M6281 Muscle weakness (generalized): Secondary | ICD-10-CM | POA: Diagnosis present

## 2021-10-09 DIAGNOSIS — S76011A Strain of muscle, fascia and tendon of right hip, initial encounter: Secondary | ICD-10-CM | POA: Insufficient documentation

## 2021-10-23 ENCOUNTER — Ambulatory Visit (HOSPITAL_BASED_OUTPATIENT_CLINIC_OR_DEPARTMENT_OTHER): Payer: BC Managed Care – PPO | Attending: Orthopaedic Surgery | Admitting: Physical Therapy

## 2021-10-23 ENCOUNTER — Other Ambulatory Visit: Payer: Self-pay

## 2021-10-23 ENCOUNTER — Encounter (HOSPITAL_BASED_OUTPATIENT_CLINIC_OR_DEPARTMENT_OTHER): Payer: Self-pay | Admitting: Physical Therapy

## 2021-10-23 DIAGNOSIS — M25551 Pain in right hip: Secondary | ICD-10-CM | POA: Diagnosis not present

## 2021-10-23 DIAGNOSIS — M6281 Muscle weakness (generalized): Secondary | ICD-10-CM | POA: Insufficient documentation

## 2021-10-23 NOTE — Therapy (Signed)
OUTPATIENT PHYSICAL THERAPY TREATMENT NOTE   Patient Name: Joanna Mann MRN: [id number] DOB:January 02, 1976, 46 y.o., female Today's Date: 10/23/2021  PCP: Reynold Bowen., MD  PT End of Session - 10/23/21 0844     Visit Number 2    Number of Visits 9    Date for PT Re-Evaluation 11/20/21    Authorization Type BCBS    Progress Note Due on Visit --   11/09/2021   PT Start Time 0800    PT Stop Time 0841    PT Time Calculation (min) 41 min    Activity Tolerance Patient tolerated treatment well    Behavior During Therapy [facility] for tasks assessed/performed             Past Medical History:  Diagnosis Date   Acne    Anxiety    Past Surgical History:  Procedure Laterality Date   BREAST EXCISIONAL BIOPSY Left 2004, 2006   x 2, both benign   DILATION AND CURETTAGE OF UTERUS     ELBOW SURGERY     There are no problems to display for this patient.   REFERRING PROVIDER: Huel Cote, MD   REFERRING DIAG: S76.011A (ICD-10-CM) - Strain of gluteus medius of right lower extremity, initial encounter    THERAPY DIAG:  Pain in right hip   Muscle weakness (generalized)   ONSET DATE: October 2022   SUBJECTIVE:    SUBJECTIVE STATEMENT: Pt has been having right hip pain on and off for approximately 5 years.  Pt signed up for an Iron[employer] though had bursitis in bilat hips when training.  Pt increased her frequency of running beginning 2 months ago and began having increased R hip pain.  Pt was running 3 miles per day 3 x/wk.  Pt had to stop running due to pain. . Pt states she is feeling better, but has been very good limiting her activity.  Pt has not been running.  Pt reports compliance with HEP.  Pt denies any adverse effects after prior rx and had no soreness.  Pt reports she is able to doff her shoes with much less pain.       PERTINENT HISTORY: none   PAIN:  Are you having pain? 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   PLOF: Independent.  Pt able to backpack with less pain.    PATIENT GOALS Pt wants to run a couple of miles per day 3-4 times per week without pain.       OBJECTIVE:    DIAGNOSTIC FINDINGS: X ray of R hip: negative.        TODAY'S TREATMENT: Therapeutic Exercise: -Reviewed pt presentation, response to prior Rx, and pain level. -Pt performed: -S/L Hip abduction 2x10 reps with eccentric focus -S/L clamshells with GTB 2 x 10 reps -supine bridge SL 3x10 reps.   -Lateral band walks with RTB 3x10 reps -Standing hip abduction 2x10 reps bilat -SLS x 30 sec on floor and 3x30 sec on airex 3x30 sec  -Reviewed HEP and updated HEP. Pt received a HEP handout and was educated in correct form and appropriate frequency.    Manual Therapy:  -Rolling R glute with stick in L S/L'ing f/b STM with trigger point release to  R glute to improve soft tissue tightness, pain, myofascial adhesions, and mobility.     PATIENT EDUCATION:  Education details: dx, relevant anatomy, POC, rationale of exercises, exercise form.  Pt received a HEP handout and was educated in correct form and appropriate frequency.  Pt instructed she should not have pain with HEP. Person educated: Patient Education method: Explanation, Demonstration, Tactile cues, Verbal cues, and Handouts Education comprehension: verbalized understanding, returned demonstration, verbal cues required, tactile cues required, and needs further education     HOME EXERCISE PROGRAM: Access Code: [email or web address] URL: [email or web address] Date: 10/23/2021 Prepared by: Aaron Edelmanrey Ilka Lovick  Exercises Sidelying Hip Abduction - 1 x daily - 5-7 x weekly - 2 sets - 10 reps Clamshell with Resistance - 1 x daily -  4-5 x weekly - 2 sets - 10 reps Supine Bridge - 1 x daily - 5-6 x weekly - 2 sets - 10 reps Side Stepping with Resistance at Ankles - 1 x daily - 4 x weekly - 2-3 sets - 10 reps Single Leg Stance - 1 x daily - 7 x weekly - 3 reps - 30 seconds hold    ASSESSMENT:   CLINICAL IMPRESSION: Pt presents to Rx reporting improved sx's.  She has been compliant with HEP and not running currently.  PT reviewed HEP and updated HEP.  PT progressed exercises today and Pt performed well without c/o's.  Pt demonstrates good understanding of HEP.  She has soft tissue tightness in R glute and responded well to MT.  Pt responded well to Rx having no pain after Rx.   Pt should benefit from skilled PT services to address above impairments and improve overall function.      Objective impairments include decreased activity tolerance, decreased mobility, decreased strength, and pain. These impairments are limiting patient from  running .    REHAB POTENTIAL: Good   CLINICAL DECISION MAKING: Stable/uncomplicated   EVALUATION COMPLEXITY: Low     GOALS:     SHORT TERM GOALS:   STG Name Target Date Goal status  1 Pt will be independent and compliant with HEP for improved pain, strength, and function.   Baseline:  10/23/2021 INITIAL  2 Pt's worst pain will be less than 3/10 for improved performance of running Baseline:  11/06/2021 INITIAL  3 Pt will report at least a 30% improvement in pain and sx's overall Baseline: 10/30/2021 INITIAL  4 Pt will be able to run short distance with no > 2/10 pain  Baseline: 11/06/2021 INITIAL                               LONG TERM GOALS:    LTG Name Target Date Goal status  1 Pt will demo 5/5 R hip strength t/o for improved tolerance with running and daily activities.  Baseline: 11/20/2021 INITIAL  2 Pt will demo good form with running with symmetrical Wb'ing without limping. Baseline: 11/20/2021 INITIAL  3 Pt will be able to progressively increase her running without  significant pain Baseline: 11/20/2021 INITIAL  4 Pt will be able to perform her ADLs/IADLs and normal functional mobility skills without increased hip pain.   Baseline: 11/20/2021 INITIAL    PLAN: PT FREQUENCY: 1-2x/week   PT DURATION: 6 weeks   PLANNED INTERVENTIONS: Therapeutic exercises, Therapeutic activity, Neuro Muscular re-education, Gait training, Patient/Family education, Joint mobilization, Stair training, Aquatic Therapy, Dry Needling, Electrical stimulation, Spinal mobilization, Cryotherapy, Moist heat, Taping, Ultrasound, and Manual therapy  PLAN FOR NEXT SESSION: Review and perform HEP.  Cont with glute/hip strengthening and proprio.  STW to glute and hip.  Add running drills when pt is able to tolerate.       Audie Clear III PT, DPT 10/23/21 4:46 PM

## 2021-10-26 ENCOUNTER — Encounter (HOSPITAL_BASED_OUTPATIENT_CLINIC_OR_DEPARTMENT_OTHER): Payer: Self-pay | Admitting: Physical Therapy

## 2021-11-02 ENCOUNTER — Other Ambulatory Visit: Payer: Self-pay

## 2021-11-02 ENCOUNTER — Encounter (HOSPITAL_BASED_OUTPATIENT_CLINIC_OR_DEPARTMENT_OTHER): Payer: Self-pay | Admitting: Physical Therapy

## 2021-11-02 ENCOUNTER — Ambulatory Visit (HOSPITAL_BASED_OUTPATIENT_CLINIC_OR_DEPARTMENT_OTHER): Payer: BC Managed Care – PPO | Admitting: Physical Therapy

## 2021-11-02 DIAGNOSIS — M6281 Muscle weakness (generalized): Secondary | ICD-10-CM

## 2021-11-02 DIAGNOSIS — M25551 Pain in right hip: Secondary | ICD-10-CM | POA: Diagnosis not present

## 2021-11-02 NOTE — Therapy (Signed)
OUTPATIENT PHYSICAL THERAPY TREATMENT NOTE   Patient Name: Joanna Mann MRN: [id number] DOB:[birth year], 46 y.o., female Today's Date: 11/02/2021  PCP: Reynold Bowen., MD  PT End of Session - 11/02/21 0912     Visit Number 3    Number of Visits 9    Date for PT Re-Evaluation 11/20/21    Authorization Type BCBS    Progress Note Due on Visit --   11/09/2021   PT Start Time 0805    PT Stop Time 0850    PT Time Calculation (min) 45 min    Activity Tolerance Patient tolerated treatment well    Behavior During Therapy [facility] for tasks assessed/performed              Past Medical History:  Diagnosis Date   Acne    Anxiety    Past Surgical History:  Procedure Laterality Date   BREAST EXCISIONAL BIOPSY Left 2004, 2006   x 2, both benign   DILATION AND CURETTAGE OF UTERUS     ELBOW SURGERY     There are no problems to display for this patient.   REFERRING PROVIDER: Huel Cote, MD   REFERRING DIAG: S76.011A (ICD-10-CM) - Strain of gluteus medius of right lower extremity, initial encounter    THERAPY DIAG:  Pain in right hip   Muscle weakness (generalized)   ONSET DATE: October 2022   SUBJECTIVE:    SUBJECTIVE STATEMENT: Pt denies any adverse effects after prior Rx.  Pt reports compliance with HEP.  Pt reports she is able to doff her shoes without pain.  Pt states MD told her to not run for 2 weeks after the injection.  Pt sees MD this Friday.   Pt states she has not run any.  She performed a 3 mile walk yesterday and she states she is a fast walker.  Pt reports she could feel it in her hip during the walk though had no pain.       PERTINENT HISTORY: none   PAIN:  Are you having pain? 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   PLOF: Independent.  Pt able to backpack with less pain.    PATIENT GOALS Pt wants to run a couple of miles per day 3-4 times per week without pain.       OBJECTIVE:    DIAGNOSTIC FINDINGS: X ray of R hip: negative.        TODAY'S TREATMENT:  Strength (MMT): L hip: extension:  4/5, Abd: 5/5, ER:  4/5, IR:  4+/5  Therapeutic Exercise: -Reviewed pt presentation, response to prior Rx, and pain level. -Assessed hip strength.  -Pt performed: -Qped donkey kicks 2x10 bilat -supine bridge SL with contralteral leg straight 3x10 reps.   -Lateral band walks with GTB 3x10 reps -Monster walks with RTB 2x10 reps -Standing hip abduction and hip extension with RTB 2x10 reps each bilat -SLS on airex 3x30 sec  -Reviewed HEP and updated HEP. Pt received a HEP handout and was educated in correct form and appropriate frequency.    Manual Therapy:  -Rolling R glute with stick in L S/L'ing f/b STM with trigger point release to R glute to improve soft tissue tightness, pain, myofascial adhesions, and mobility.  PATIENT EDUCATION:  Education details: Answered Pt's questions.  Educated pt in objective findings and instructed to hold off on running currently due to strength deficits. relevant anatomy, POC, rationale of exercises, and exercise form.  Updated HEP.  Pt received a HEP handout and was educated in correct form and appropriate frequency.  Pt instructed she should not have pain with HEP and to decrease exercises if she is having significant soreness or pain. Person educated: Patient Education method: Explanation, Demonstration, Tactile cues, Verbal cues, and Handouts Education comprehension: verbalized understanding, returned demonstration, verbal cues required, tactile cues required, and needs further education     HOME EXERCISE PROGRAM: Access Code: [email or web address] URL: [email or web address] Date: 11/02/2021 Prepared by:  Aaron Edelman  Exercises Sidelying Hip Abduction - 1 x daily - 5-7 x weekly - 2 sets - 10 reps Clamshell with Resistance - 1 x daily - 4-5 x weekly - 2 sets - 10 reps Supine Bridge - 1 x daily - 5-6 x weekly - 2 sets - 10 reps Side Stepping with Resistance at Ankles - 1 x daily - 4 x weekly - 2-3 sets - 10 reps Single Leg Stance - 1 x daily - 7 x weekly - 3 reps - 30 seconds hold Hip Abduction with Resistance Loop - 1 x daily - 3 x weekly - 2 sets - 10 reps Hip Extension with Resistance Loop - 1 x daily - 3-4 x weekly - 2 sets - 10 reps     ASSESSMENT:   CLINICAL IMPRESSION: Pt is progressing well and reports improved sx's including not having pain when doffing shoe.  She has not been running in order to allow time for strength gains and healing.  Pt did have questions concerning running today and PT assessed strength.  She demonstrated improved hip abd and IR strength and had no change in hip extension and ER strength.  She continues to have weakness in R glute though has only received 2  PT treatments other than today's treatment.  She is compliant with HEP.  PT progressed exercises today to improve glue and hip strength and updated HEP.  She responded well to Rx having no pain and no c/o's after Rx.  Pt should benefit from skilled PT services to address above impairments and improve overall function.     Objective impairments include decreased activity tolerance, decreased mobility, decreased strength, and pain. These impairments are limiting patient from  running .     REHAB POTENTIAL: Good   CLINICAL DECISION MAKING: Stable/uncomplicated   EVALUATION COMPLEXITY: Low     GOALS:     SHORT TERM GOALS:   STG Name Target Date Goal status  1 Pt will be independent and compliant with HEP for improved pain, strength, and function.   Baseline:  10/23/2021 INITIAL  2 Pt's worst pain will be less than 3/10 for improved performance of running Baseline:  11/06/2021 INITIAL  3 Pt will  report at least a 30% improvement in pain and sx's overall Baseline: 10/30/2021 INITIAL  4 Pt will be able to run short distance with no > 2/10 pain  Baseline: 11/06/2021 INITIAL                               LONG TERM GOALS:    LTG Name Target Date Goal status  1 Pt will demo 5/5 R hip strength t/o for improved tolerance with running and daily activities.  Baseline: 11/20/2021 INITIAL  2 Pt will demo good form with running with symmetrical Wb'ing without limping. Baseline: 11/20/2021 INITIAL  3 Pt will be able to progressively increase her running without significant pain Baseline: 11/20/2021 INITIAL  4 Pt will be able to perform her ADLs/IADLs and normal functional mobility skills without increased hip pain.   Baseline: 11/20/2021 INITIAL    PLAN: PT FREQUENCY: 1-2x/week   PT DURATION: 6 weeks   PLANNED INTERVENTIONS: Therapeutic exercises, Therapeutic activity, Neuro Muscular re-education, Gait training, Patient/Family education, Joint mobilization, Stair training, Aquatic Therapy, Dry Needling, Electrical stimulation, Spinal mobilization, Cryotherapy, Moist heat, Taping, Ultrasound, and Manual therapy   PLAN FOR NEXT SESSION: Review and perform HEP.  Cont with glute/hip strengthening and proprio.  STW to glute and hip.  Add running drills when pt is able to tolerate.  Possibly assess jogging next visit.  SLS with running form on airex next visit.       Audie Clearoby Rayaan Garguilo III PT, DPT 11/02/21 9:16 AM

## 2021-11-06 ENCOUNTER — Ambulatory Visit (INDEPENDENT_AMBULATORY_CARE_PROVIDER_SITE_OTHER): Payer: BC Managed Care – PPO | Admitting: Orthopaedic Surgery

## 2021-11-06 ENCOUNTER — Other Ambulatory Visit: Payer: Self-pay

## 2021-11-06 DIAGNOSIS — S76011A Strain of muscle, fascia and tendon of right hip, initial encounter: Secondary | ICD-10-CM

## 2021-11-06 NOTE — Progress Notes (Signed)
Chief Complaint: right hip pain     History of Present Illness:   11/06/2021: Presents today for follow-up after right hip injection.  Overall she is gotten complete relief.  That being said she has stopped running at this point.  She is working on physical therapy for gluteal strengthening program.  Joanna Mann is a 46 y.o. female presents with right hip pain is been going on and off for approximately 5 years.  She states that she is an avid runner and running water really flared this up.  This is to the point where she is been only able to run approximately 1-1/2 miles.  Her goal is to ultimately run a marathon.  States that the hip pain is limiting her ability to do this.  She is having difficulty laying directly on the side.  She takes Aleve which does not relieve the pain.  She has not had injections    Surgical History:   None  PMH/PSH/Family History/Social History/Meds/Allergies:    Past Medical History:  Diagnosis Date   Acne    Anxiety    Past Surgical History:  Procedure Laterality Date   BREAST EXCISIONAL BIOPSY Left 2004, 2006   x 2, both benign   DILATION AND CURETTAGE OF UTERUS     ELBOW SURGERY     Social History   Socioeconomic History   Marital status: Married    Spouse name: Not on file   Number of children: Not on file   Years of education: Not on file   Highest education level: Not on file  Occupational History   Not on file  Tobacco Use   Smoking status: Never   Smokeless tobacco: Not on file  Substance and Sexual Activity   Alcohol use: Yes   Drug use: No   Sexual activity: Yes    Birth control/protection: Pill  Other Topics Concern   Not on file  Social History Narrative   Not on file   Social Determinants of Health   Financial Resource Strain: Not on file  Food Insecurity: Not on file  Transportation Needs: Not on file  Physical Activity: Not on file  Stress: Not on file  Social Connections:  Not on file   Family History  Problem Relation Age of Onset   Breast cancer Mother    Breast cancer Paternal Grandmother    Allergies  Allergen Reactions   Sulfa Antibiotics Other (See Comments)    Neck pain and dizziness   Current Outpatient Medications  Medication Sig Dispense Refill   amphetamine-dextroamphetamine (ADDERALL XR) 20 MG 24 hr capsule Take 20 mg by mouth daily.     doxycycline (ORACEA) 40 MG capsule Take 40 mg by mouth every morning.     meclizine (ANTIVERT) 25 MG tablet Take 1 tablet (25 mg total) by mouth 4 (four) times daily. 28 tablet 0   PARoxetine HCl (PAXIL PO) Take by mouth.     No current facility-administered medications for this visit.   No results found.  Review of Systems:   A ROS was performed including pertinent positives and negatives as documented in the HPI.  Physical Exam :   Constitutional: NAD and appears stated age Neurological: Alert and oriented Psych: Appropriate affect and cooperative There were no vitals taken for this visit.   Comprehensive Musculoskeletal Exam:  Inspection Right Left  Skin No atrophy or gross abnormalities appreciated No atrophy or gross abnormalities appreciated  Palpation    Tenderness None None  Crepitus None None  Range of Motion    Flexion (passive) 120 120  Extension 30 30  IR 30 30  ER 40 40  Strength    Flexion  5/5 5/5  Extension 5/5 5/5  Special Tests    FABIR Negative Negative  FADER Negative Negative  ER Lag/Capsular Insufficiency Negative Negative  Instability Negative Negative  Sacroiliac pain Negative  Negative   Instability    Generalized Laxity No No  Neurologic    sciatic, femoral, obturator nerves intact to light sensation  Vascular/Lymphatic    DP pulse 2+ 2+  Lumbar Exam    Patient has symmetric lumbar range of motion with negative pain referral to hip   Weakness with resisted abduction compared to contralateral side  Imaging:   Xray (3 views right hip and AP  pelvis): Normal  I personally reviewed and interpreted the radiographs.   Assessment:   46 year old female with right hip pain now going on for 5 years.  Specifically I do believe that she is got gluteus medius tendinitis versus tearing.  At this point she is feeling much better after the injection.  She will continue her gluteal strengthening program.  I have advised to follow back up with me in 1 month if she continues to have pain with return to running program  Plan :    -Return to clinic following a return to running program if she is not experiencing complete relief   I personally saw and evaluated the patient, and participated in the management and treatment plan.  Huel Cote, MD Attending Physician, Orthopedic Surgery  This document was dictated using Dragon voice recognition software. A reasonable attempt at proof reading has been made to minimize errors.

## 2021-11-11 ENCOUNTER — Encounter (HOSPITAL_BASED_OUTPATIENT_CLINIC_OR_DEPARTMENT_OTHER): Payer: BC Managed Care – PPO | Admitting: Physical Therapy

## 2021-11-16 ENCOUNTER — Encounter (HOSPITAL_BASED_OUTPATIENT_CLINIC_OR_DEPARTMENT_OTHER): Payer: BC Managed Care – PPO | Admitting: Physical Therapy

## 2021-11-22 NOTE — Therapy (Addendum)
OUTPATIENT PHYSICAL THERAPY TREATMENT NOTE / PROGRESS NOTE   Patient Name: Joanna Mann MRN: [id number] DOB:Apr 11, 1976, 46 y.o., female Today's Date: 11/23/2021  PCP: Frazier Butt., MD  PT End of Session - 11/23/21 [phone]     Visit Number 4    Number of Visits 9    Date for PT Re-Evaluation 12/21/21    Authorization Type [employer]    PT Start Time 0810    PT Stop Time 0850    PT Time Calculation (min) 40 min    Activity Tolerance Patient tolerated treatment well    Behavior During Therapy [facility] for tasks assessed/performed               Past Medical History:  Diagnosis Date   Acne    Anxiety    Past Surgical History:  Procedure Laterality Date   BREAST EXCISIONAL BIOPSY Left 2004, 2006   x 2, both benign   DILATION AND CURETTAGE OF UTERUS     ELBOW SURGERY     There are no problems to display for this patient.   REFERRING PROVIDER: Vanetta Mulders, MD   REFERRING DIAG: S76.011A (ICD-10-CM) - Strain of gluteus medius of right lower extremity, initial encounter    THERAPY DIAG:  Pain in right hip   Muscle weakness (generalized)   ONSET DATE: October 2022   SUBJECTIVE:    SUBJECTIVE STATEMENT: Pt denies any adverse effects after prior Rx.  Pt reports compliance with HEP.  Pt reports she is able to doff her shoes without pain.  Pt states MD told her to not run for 2 weeks after the injection.  Pt sees MD this Friday.   Pt states she has not run any.  She performed a 3 mile walk yesterday and she states she is a fast walker.  Pt reports she could feel it in her hip during the walk though had no pain.    Pt states she saw MD and he informed her she to start running.  If she is not doing well in a month, MD plans to do a MRI.   Pt states her R LE was still weaker at  MD.  "I think PT has helped". Pt states she has only tried a walk/run on the track in which she ran a mile total.  Pt ran 1/4 of the track and walked 3/4 until she ran about a mile.  Pt had no adverse  effects after prior Rx.  Pt states she is able to push down and remove shoe in standing without pain which caused pain initially.  Pt states her R LE still is weaker     PERTINENT HISTORY: none   PAIN:  Are you having pain? 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   PLOF: Independent.  Pt able to backpack with less pain.    PATIENT GOALS Pt wants to run a couple of miles per day 3-4 times per week without pain.       OBJECTIVE:    DIAGNOSTIC FINDINGS: X ray of R hip: negative.        TODAY'S TREATMENT:  PATIENT SURVEYS:  FOTO 85 with a goal of 80  Strength (MMT): L hip: extension:  4+/5, Abd:  5/5, ER:  4+/5, IR:  4+/5 R hip:  extension and IR:  5/5    Therapeutic Exercise: -Reviewed current function, HEP compliance, response to prior Rx, and pain level. -Assessed hip strength.  -Pt performed: -bike x 3 mins at L2 -Pt jogged 1/2 court multiple laps and full court 1 lap -RTRP:  Pt performed low skips, side skips, and running step overs     PATIENT EDUCATION:  Education details: Answered Pt's questions.  Educated pt in objective findings.  HEP and added low skips and side skips to HEP.  Educated pt in walk/jog program and how to appropriately progress.  Person educated: Patient Education method: Explanation, Demonstration, Tactile cues, Verbal cues, and Handouts Education comprehension: verbalized understanding, returned demonstration, verbal cues required, tactile cues required, and needs further education     HOME EXERCISE PROGRAM: Access Code: [email or web address] URL: [email or web address] Date: 11/02/2021 Prepared by: Ronny Flurry  Exercises Sidelying Hip Abduction - 1  x daily - 5-7 x weekly - 2 sets - 10 reps Clamshell with Resistance - 1 x daily - 4-5 x weekly - 2 sets - 10 reps Supine Bridge - 1 x daily - 5-6 x weekly - 2 sets - 10 reps Side Stepping with Resistance at Ankles - 1 x daily - 4 x weekly - 2-3 sets - 10 reps Single Leg Stance - 1 x daily - 7 x weekly - 3 reps - 30 seconds hold Hip Abduction with Resistance Loop - 1 x daily - 3 x weekly - 2 sets - 10 reps Hip Extension with Resistance Loop - 1 x daily - 3-4 x weekly - 2 sets - 10 reps     ASSESSMENT:   CLINICAL IMPRESSION: Pt is progressing well and reports improved sx's.  Pt saw MD who instructed her to begin running.  Pt has performed a run/walk program for 1 time without any adverse effects.  Other than that, she has not been running.  Pt denies any pain with doffing shoe in standing which used to cause pain.  She is compliant with HEP.  Pt demonstrates improved R hip extension and ER strength today and improved abd and IR strength overall.  Pt continues to have slight deficits in hip ext, IR, and ER strength. Pt demonstrates clinically significant improvement in self perceived disability with FOTO score improving from 61 initially to 85 currently and met the goal of 80.  Pt has met STG's #1,3 and LTG#4 and partially met STG #2 and LTG #1.  Pt jogged in the clinic today without any pain.  She had no pain during or after jogging and RTRP drills.  Pt demonstrates good form with side skips though had difficulty with low skips and running step overs.  Pt demonstrates improved form with low skips with cuing, instruction and repetition though continued to have incorrect form with running step overs.  She responded well to Rx having no pain and no c/o's after Rx.  Pt should benefit from continued skilled PT services to address above impairments and return to PLOF.     Objective impairments include decreased activity tolerance, decreased mobility, decreased strength, and pain. These impairments are  limiting patient from  running.     REHAB POTENTIAL: Good   CLINICAL DECISION MAKING: Stable/uncomplicated   EVALUATION COMPLEXITY: Low     GOALS:     SHORT TERM GOALS:   STG Name Target Date Goal status  1 Pt will be independent and compliant with HEP for improved pain, strength, and  function.   Baseline:  10/23/2021 GOAL MET  2 Pt's worst pain will be less than 3/10 for improved performance of running Baseline:  11/06/2021 PARTIALLY MET  3 Pt will report at least a 30% improvement in pain and sx's overall Baseline: 10/30/2021 GOAL MET  4 Pt will be able to run short distance with no > 2/10 pain  Baseline: 11/06/2021 INITIAL                               LONG TERM GOALS:    LTG Name Target Date Goal status  1 Pt will demo 5/5 R hip strength t/o for improved tolerance with running and daily activities.  Baseline: 12/21/2021 PARTIALLY MET  2 Pt will demo good form with running with symmetrical Wb'ing without limping. Baseline: 12/21/2021 PROGRESSING  3 Pt will be able to progressively increase her running without significant pain Baseline: 12/21/2021 NOT MET  4 Pt will be able to perform her ADLs/IADLs and normal functional mobility skills without increased hip pain.   Baseline: 11/20/2021 GOAL MET    PLAN: PT FREQUENCY: 1 x/week   PT DURATION: 4 weeks   PLANNED INTERVENTIONS: Therapeutic exercises, Therapeutic activity, Neuro Muscular re-education, Gait training, Patient/Family education, Joint mobilization, Stair training, Aquatic Therapy, Dry Needling, Electrical stimulation, Spinal mobilization, Cryotherapy, Moist heat, Taping, Ultrasound, and Manual therapy   PLAN FOR NEXT SESSION: Review RTR drills and add hurdle walks. Assess walk/jog program.  Cont with glute/hip strengthening and proprio.  SLS with running form on airex next visit.       Selinda Michaels III PT, DPT 11/23/21 4:58 PM  PHYSICAL THERAPY DISCHARGE SUMMARY  Visits from Start of Care: 4  Current  functional level related to goals / functional outcomes: Pt made great progress in PT.  She did not return after 11/23/21 appointment.  See above for goal progress.   Remaining deficits: See above   Education / Equipment: Pt has a HEP.   Pt was seen in PT from 10/09/21 - 11/23/21.  She cancelled her following 2 appointments and will be considered discharged at this time.  Pt was beginning to perform a run/walk program.  She will cont with HEP and running program.  Patient is being discharged due to not returning since the last visit.  Selinda Michaels III PT, DPT 03/09/22 8:35 AM

## 2021-11-23 ENCOUNTER — Encounter (HOSPITAL_BASED_OUTPATIENT_CLINIC_OR_DEPARTMENT_OTHER): Payer: Self-pay | Admitting: Physical Therapy

## 2021-11-23 ENCOUNTER — Other Ambulatory Visit: Payer: Self-pay

## 2021-11-23 ENCOUNTER — Ambulatory Visit (HOSPITAL_BASED_OUTPATIENT_CLINIC_OR_DEPARTMENT_OTHER): Payer: BC Managed Care – PPO | Attending: Orthopaedic Surgery | Admitting: Physical Therapy

## 2021-11-23 DIAGNOSIS — M25551 Pain in right hip: Secondary | ICD-10-CM | POA: Diagnosis present

## 2021-11-23 DIAGNOSIS — M6281 Muscle weakness (generalized): Secondary | ICD-10-CM | POA: Diagnosis present

## 2021-11-30 ENCOUNTER — Encounter (HOSPITAL_BASED_OUTPATIENT_CLINIC_OR_DEPARTMENT_OTHER): Payer: BC Managed Care – PPO | Admitting: Physical Therapy

## 2021-12-07 ENCOUNTER — Encounter (HOSPITAL_BASED_OUTPATIENT_CLINIC_OR_DEPARTMENT_OTHER): Payer: BC Managed Care – PPO | Admitting: Physical Therapy
